# Patient Record
Sex: Female | Born: 1995 | Race: Black or African American | Hispanic: No | Marital: Single | Smoking: Never smoker
Health system: Southern US, Community
[De-identification: ages and names within clinical notes are randomized; demographics above are authoritative.]

## PROBLEM LIST (undated history)

## (undated) DIAGNOSIS — J40 Bronchitis, not specified as acute or chronic: Secondary | ICD-10-CM

---
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
## 2014-07-28 ENCOUNTER — Emergency Department (HOSPITAL_BASED_OUTPATIENT_CLINIC_OR_DEPARTMENT_OTHER)
Admission: EM | Admit: 2014-07-28 | Discharge: 2014-07-28 | Disposition: A | Discharge status: Home / Self Care | Payer: Commercial Managed Care - HMO | Attending: Emergency Medicine | Admitting: Emergency Medicine

## 2014-07-28 ENCOUNTER — Encounter (HOSPITAL_BASED_OUTPATIENT_CLINIC_OR_DEPARTMENT_OTHER): Payer: Self-pay | Admitting: Emergency Medicine

## 2014-07-28 DIAGNOSIS — N946 Dysmenorrhea, unspecified: Secondary | ICD-10-CM

## 2014-07-28 DIAGNOSIS — R109 Unspecified abdominal pain: Secondary | ICD-10-CM | POA: Diagnosis present

## 2014-07-28 DIAGNOSIS — Z8709 Personal history of other diseases of the respiratory system: Secondary | ICD-10-CM | POA: Insufficient documentation

## 2014-07-28 DIAGNOSIS — Z3202 Encounter for pregnancy test, result negative: Secondary | ICD-10-CM | POA: Diagnosis not present

## 2014-07-28 HISTORY — DX: Bronchitis, not specified as acute or chronic: J40

## 2014-07-28 LAB — PREGNANCY, URINE: PREG TEST UR: NEGATIVE

## 2014-07-28 MED ORDER — NAPROXEN 500 MG PO TABS: 500.0000 mg | ORAL_TABLET | Freq: Two times a day (BID) | ORAL | Status: DC

## 2014-07-28 MED ORDER — IBUPROFEN 800 MG PO TABS
800.0000 mg | ORAL_TABLET | Freq: Once | ORAL | Status: AC
  Administered 2014-07-28: 800 mg via ORAL
  Filled 2014-07-28: qty 1

## 2014-07-28 MED ORDER — METHOCARBAMOL 500 MG PO TABS: 500.0000 mg | ORAL_TABLET | Freq: Two times a day (BID) | ORAL | Status: DC

## 2014-07-28 MED ORDER — METHOCARBAMOL 500 MG PO TABS
ORAL_TABLET | ORAL | Status: AC
  Administered 2014-07-28: 500 mg
  Filled 2014-07-28: qty 1

## 2014-07-28 NOTE — ED Provider Notes (Signed)
CSN: [id number]     Arrival date & time 07/28/14  0531 History   First MD Initiated Contact with Patient 07/28/14 [phone]     Chief Complaint  Patient presents with  . Dysmenorrhea     (Consider location/radiation/quality/duration/timing/severity/associated sxs/prior Treatment) Patient is a 19 y.o. female presenting with cramps. The history is provided by the patient.  Abdominal Cramping This is a recurrent problem. The current episode started 3 to 5 hours ago. The problem occurs constantly. The problem has not changed since onset.Pertinent negatives include no headaches and no shortness of breath. Nothing aggravates the symptoms. Nothing relieves the symptoms. She has tried nothing for the symptoms. The treatment provided no relief.  Period started tonight and patient normally has cramps with it but these were unrelieved with one dose of otc pain reliever  Past Medical History  Diagnosis Date  . Bronchitis    History reviewed. No pertinent past surgical history. History reviewed. No pertinent family history. History  Substance Use Topics  . Smoking status: Never Smoker   . Smokeless tobacco: Never Used  . Alcohol Use: No   OB History    Gravida Para Term Preterm AB TAB SAB Ectopic Multiple Living       Review of Systems  Respiratory: Negative for shortness of breath.   Genitourinary: Positive for menstrual problem.  Neurological: Negative for headaches.  All other systems reviewed and are negative.     Allergies  Review of patient's allergies indicates no known allergies.  Home Medications   Prior to Admission medications   Not on File   BP 123/80 mmHg  Pulse 78  Temp(Src) 98.2 F (36.8 C) (Oral)  Resp 18  Ht  (1.6 m)  Wt 150 lb (68.04 kg)  BMI 26.58 kg/m2  SpO2 100%  LMP 07/28/2014 Physical Exam  Constitutional: She is oriented to person, place, and time. She appears well-developed and well-nourished. No distress.  HENT:  Head:  Normocephalic and atraumatic.  Mouth/Throat: Oropharynx is clear and moist.  Eyes: Conjunctivae and EOM are normal. Pupils are equal, round, and reactive to light.  Neck: Normal range of motion. Neck supple.  Cardiovascular: Normal rate, regular rhythm and intact distal pulses.   Pulmonary/Chest: Effort normal and breath sounds normal. She has no wheezes. She has no rales.  Abdominal: Soft. Bowel sounds are normal. There is no tenderness. There is no rebound and no guarding.  Musculoskeletal: Normal range of motion.  Neurological: She is alert and oriented to person, place, and time.  Skin: Skin is warm and dry.  Psychiatric: She has a normal mood and affect.    ED Course  Procedures (including critical care time) Labs Review Labs Reviewed  PREGNANCY, URINE    Imaging Review No results found.   EKG Interpretation None      MDM   Final diagnoses:  None   NSAIDs heat and muscle relaxant lots of liquids and follow up with GYN   Elka Satterfield, MD 07/28/14 1191

## 2014-07-28 NOTE — Discharge Instructions (Signed)
Dysmenorrhea °Dysmenorrhea is pain during a menstrual period. You will have pain in the lower belly (abdomen). The pain is caused by the tightening (contracting) of the muscles of the uterus. The pain can be minor or severe. Headache, feeling sick to your stomach (nausea), throwing up (vomiting), or low back pain may occur with this condition. °HOME CARE °· Only take medicine as told by your doctor. °· Place a heating pad or hot water bottle on your lower back or belly. Do not sleep with a heating pad. °· Exercise may help lessen the pain. °· Massage the lower back or belly. °· Stop smoking. °· Avoid alcohol and caffeine. °GET HELP IF:  °· Your pain does not get better with medicine. °· You have pain during sex. °· Your pain gets worse while taking pain medicine. °· Your period bleeding is heavier than normal. °· You keep feeling sick to your stomach or keep throwing up. °GET HELP RIGHT AWAY IF: °You pass out (faint). °Document Released: 03/22/2008 Document Revised: 12/29/2012 Document Reviewed: 06/11/2012 °[employer]® Patient Information ©2015 [employer]. This information is not intended to replace advice given to you by your health care provider. Make sure you discuss any questions you have with your health care provider. ° °

## 2014-07-28 NOTE — ED Notes (Signed)
Heat packs applied per Dr's order.

## 2014-07-28 NOTE — ED Notes (Signed)
Pt re[ports onset of severe menstraul cramping and pain at 12:30 last night pt reports as more severe than ever before

## 2014-08-05 IMAGING — CR DG CHEST 2V
2 series · 2 of 2 positions shown · non-contrast
Comparison: None available

CLINICAL DATA: Chest pain

EXAM:
CHEST - 2 VIEW

[w chest pa]
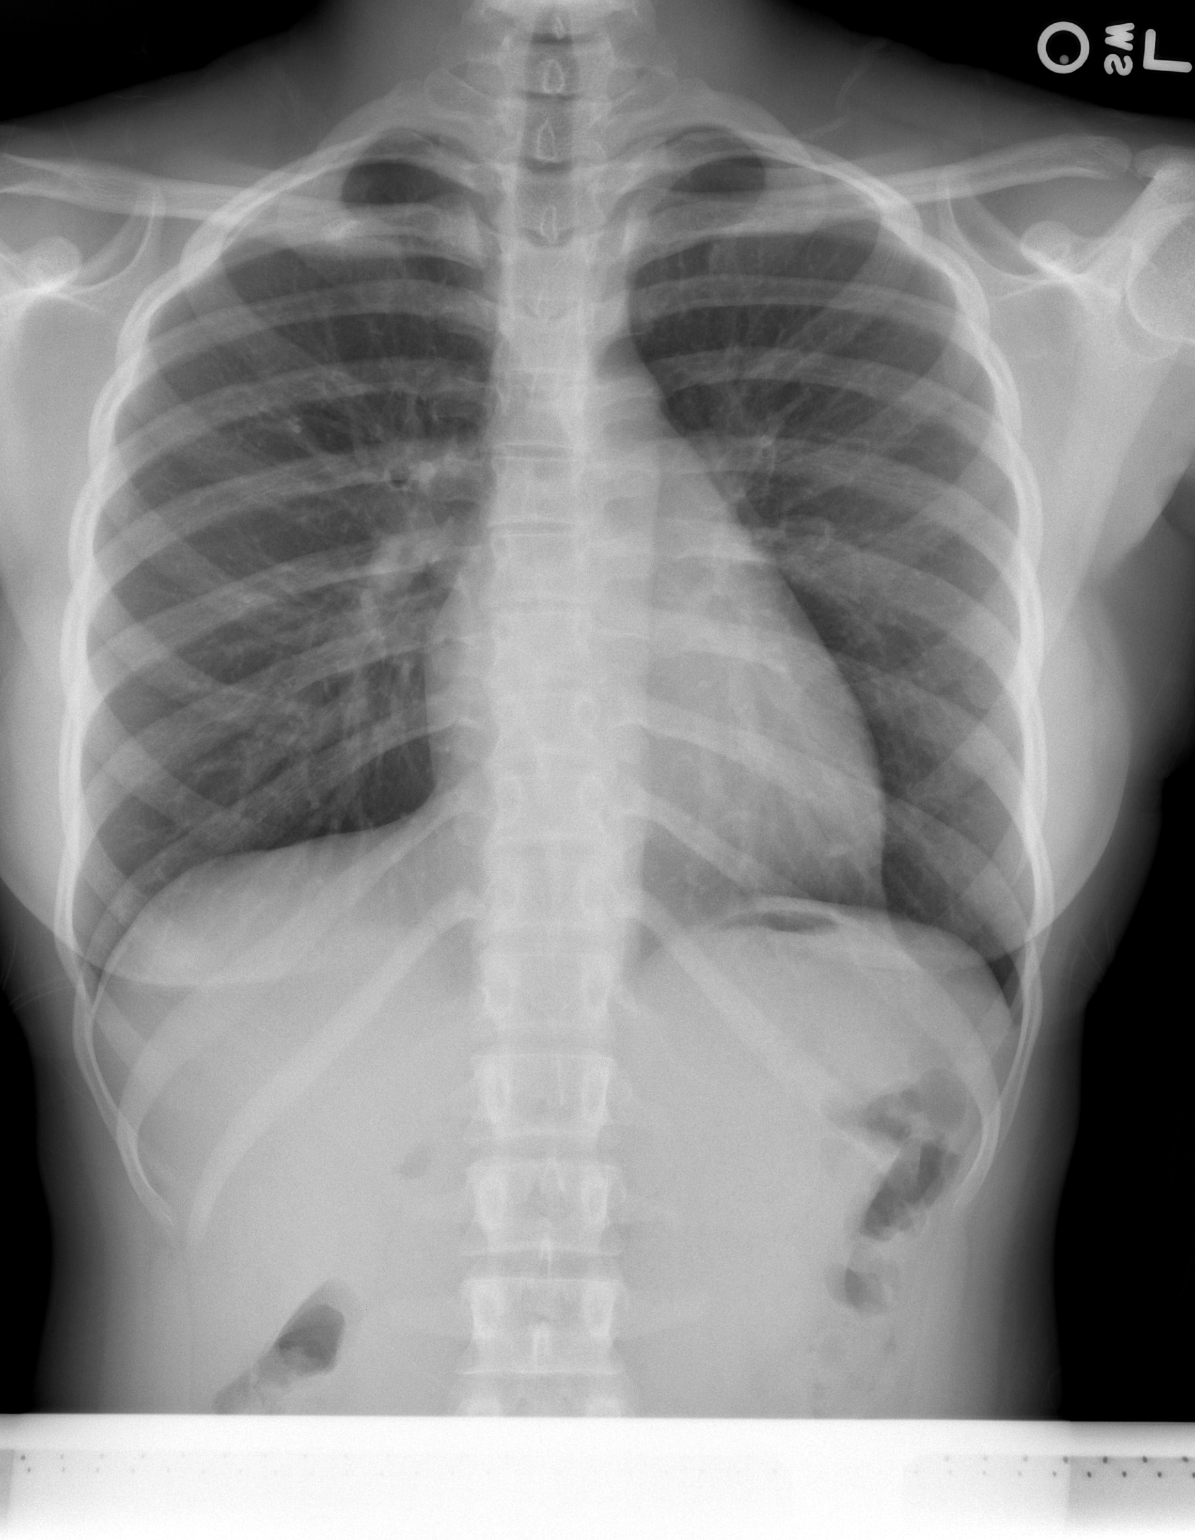

[w chest lat]
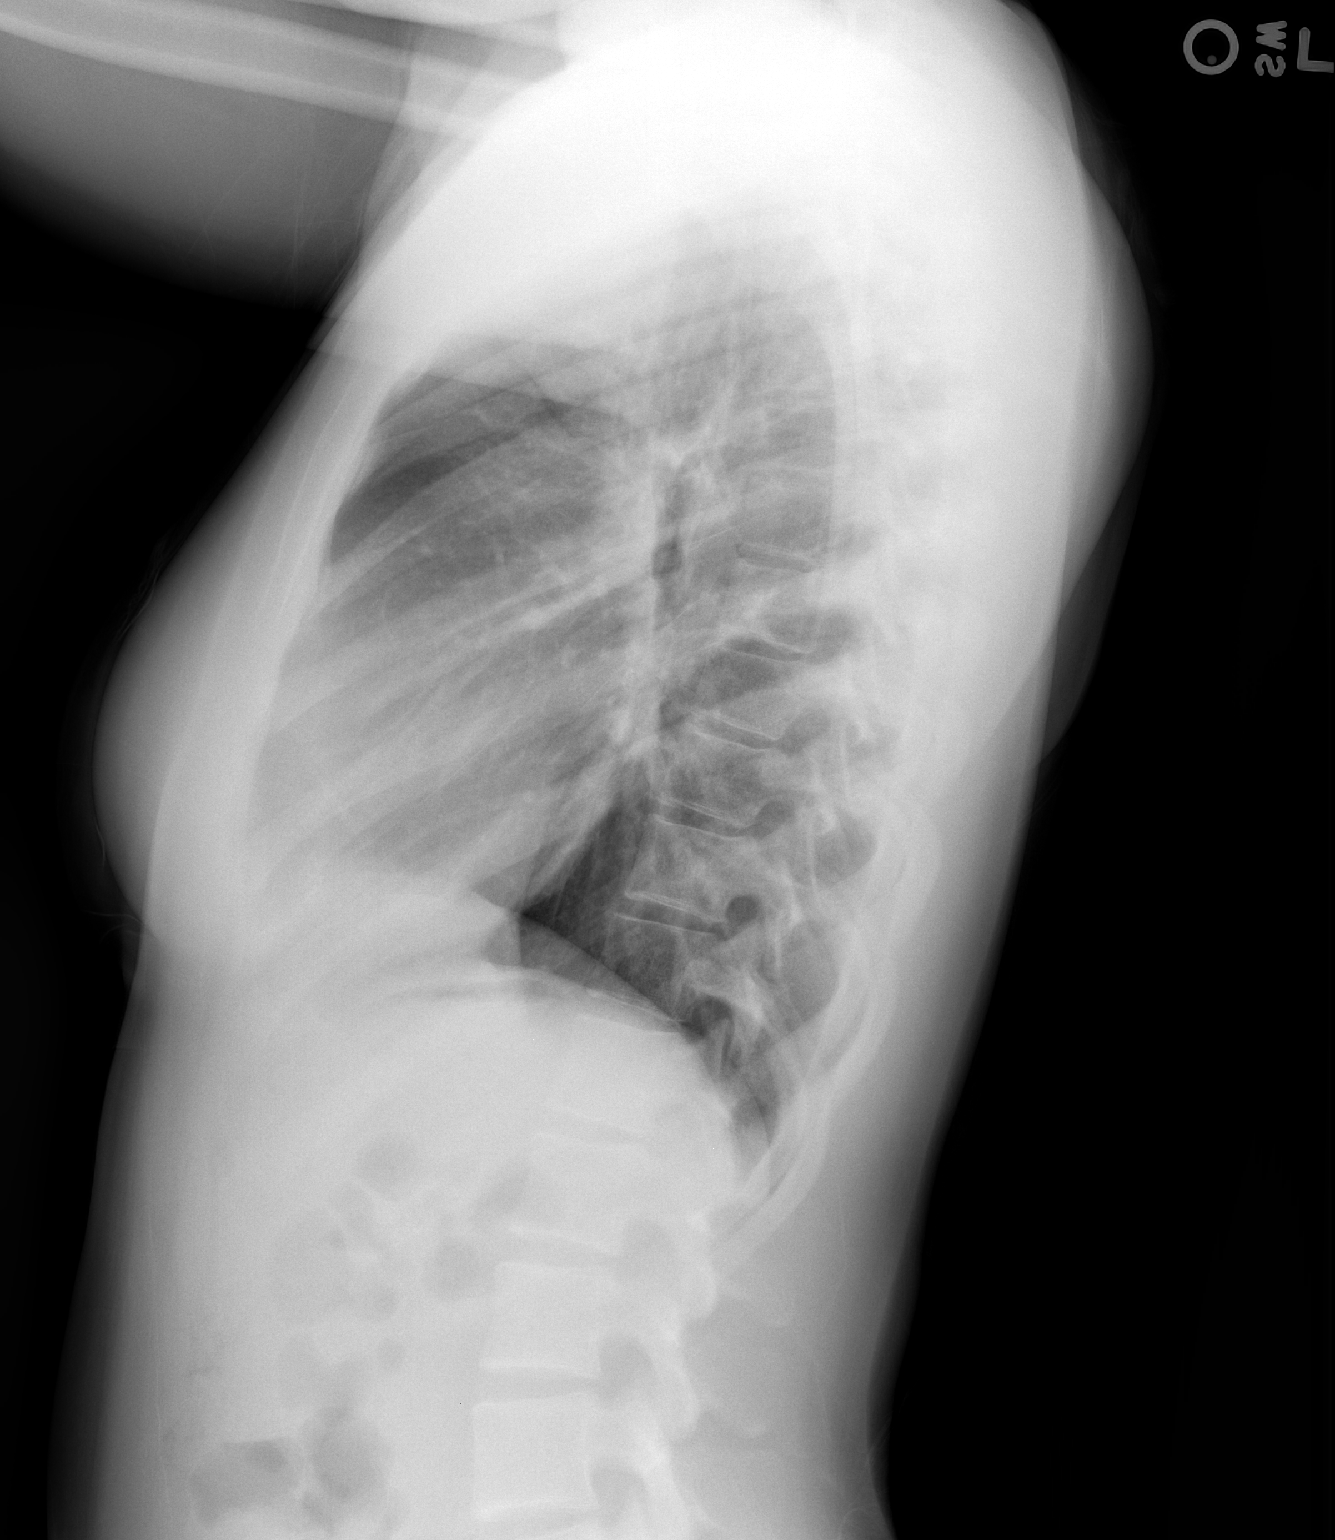

[2 of 2 positions shown; findings below may reference images not displayed]

FINDINGS: The heart size and mediastinal contours are within normal limits.
Both lungs are clear. The visualized skeletal structures are
unremarkable. No effusion.
IMPRESSION: No acute cardiopulmonary disease.

## 2016-03-18 ENCOUNTER — Ambulatory Visit (HOSPITAL_COMMUNITY)
Admission: EM | Admit: 2016-03-18 | Discharge: 2016-03-18 | Disposition: A | Discharge status: Home / Self Care | Payer: Commercial Managed Care - HMO | Attending: Emergency Medicine | Admitting: Emergency Medicine

## 2016-03-18 ENCOUNTER — Encounter (HOSPITAL_COMMUNITY): Payer: Self-pay | Admitting: Emergency Medicine

## 2016-03-18 DIAGNOSIS — L509 Urticaria, unspecified: Secondary | ICD-10-CM | POA: Diagnosis not present

## 2016-03-18 MED ORDER — PREDNISONE 10 MG (21) PO TBPK: ORAL_TABLET | ORAL | 0 refills | Status: AC

## 2016-03-18 NOTE — ED Triage Notes (Addendum)
Generalized rash from shoulders down, onset 2 days ago.  Rash itches   Patient says she has swelling in hands and knees.  No diagnoses during previous episode.

## 2016-03-18 NOTE — ED Provider Notes (Signed)
HPI  SUBJECTIVE:  Denise Hahn is a 21 y.o. female who presents with a diffuse urticarial itching   rash from her shoulders down.  She also states that she is having swelling and dull, achy pain in the joints of her fingers in her fingers and of her knees. She states that she started a new baby body wash that she does not use her face. There is no rash on her face. No lip or tongue swelling, difficulty breathing. She has tried cortisone cream and a cooling gel and an oatmeal bath with some improvement of symptoms. No aggravating factors.  States that the rash is itchy all day long. No fevers, recent viral illness, sore throat, new lotions, detergents, foods, medications. no Pets in the house. No contacts with similar rash. No antibiotics in the past month.  states that she has had similar symptoms before but no cause was found and  has  a history of seasonal allergies. past medical history negative for lupus, rheumatoid arthritis, anaphylaxis. Family history negative for rheumatoid arthritis or autoimmune disease. LMP: 2/15. Denies possibility of being pregnant. [facility]    Past Medical History:  Diagnosis Date  . Bronchitis     History reviewed. No pertinent surgical history.  No family history on file.  Social History  Substance Use Topics  . Smoking status: Never Smoker  . Smokeless tobacco: Never Used  . Alcohol use Yes    No current facility-administered medications for this encounter.   Current Outpatient Prescriptions:  .  predniSONE (STERAPRED UNI-PAK 21 TAB) 10 MG (21) TBPK tablet, Dispense one 6 day pack. Take as directed with food., Disp: 21 tablet, Rfl: 0  No Known Allergies   ROS  As noted in HPI.   Physical Exam  BP 116/74 (BP Location: Right Arm)   Pulse 76   Temp 98.6 F (37 C) (Oral)   Resp 16   LMP 02/22/2016   SpO2 100%   Constitutional: Well developed, well nourished, no acute distress Eyes:  EOMI,  conjunctiva normal bilaterally HENT: Normocephalic, atraumatic,mucus membranes moist Respiratory: Normal inspiratory effort Cardiovascular: Normal rate GI: nondistended skin: No rash, skin intact Musculoskeletal: no deformities. No joint swelling or tenderness on the hands or knees. No joint erythema, limitation of motion. Neurologic: Alert & oriented x 3, no focal neuro deficits Psychiatric: Speech and behavior appropriate   ED Course   Medications - No data to display  No orders of the defined types were placed in this encounter.   No results found for this or any previous visit (from the past 24 hour(s)). No results found.  ED Clinical Impression  Urticaria   ED Assessment/Plan  states that the rash has resolved here, however she does bring in multiple pictures showing extensive urticaria. She denies any signs of anaphylaxis. Her finger joints and knees appear normal. Plan to send home with Claritin, 25-50 mg of Benadryl at night and a 6 day prednisone taper, follow-up with primary care physician as needed, to the ER if she gets worse.  Discussed MDM, plan and followup with patient. Discussed sn/sx that should prompt return to the ED. Patient  agrees with plan.   Meds ordered this encounter  Medications  . predniSONE (STERAPRED UNI-PAK 21 TAB) 10 MG (21) TBPK tablet    Sig: Dispense one 6 day pack. Take as directed with food.    Dispense:  21 tablet    Refill:  0    *This clinic note  was created using Scientist, clinical (histocompatibility and immunogenetics). Therefore, there may be occasional mistakes despite careful proofreading.  ?   Domenick Gong, MD 03/18/16 2133

## 2016-03-18 NOTE — Discharge Instructions (Signed)
10 mg a Claritin in the morning, 25-50 mg of Benadryl at night for the next few nights. Finish the steroids. Go to the ER for the signs and symptoms we discussed.

## 2017-11-24 ENCOUNTER — Emergency Department (HOSPITAL_COMMUNITY)
Admission: EM | Admit: 2017-11-24 | Discharge: 2017-11-24 | Disposition: A | Discharge status: Home / Self Care | Payer: 59 | Attending: Emergency Medicine | Admitting: Emergency Medicine

## 2017-11-24 ENCOUNTER — Encounter (HOSPITAL_COMMUNITY): Payer: Self-pay

## 2017-11-24 DIAGNOSIS — R109 Unspecified abdominal pain: Secondary | ICD-10-CM | POA: Diagnosis not present

## 2017-11-24 DIAGNOSIS — J020 Streptococcal pharyngitis: Secondary | ICD-10-CM | POA: Insufficient documentation

## 2017-11-24 DIAGNOSIS — J029 Acute pharyngitis, unspecified: Secondary | ICD-10-CM | POA: Diagnosis present

## 2017-11-24 LAB — CBC
HCT: 41 % (ref 36.0–46.0)
Hemoglobin: 13.6 g/dL (ref 12.0–15.0)
MCH: 29.4 pg (ref 26.0–34.0)
MCHC: 33.2 g/dL (ref 30.0–36.0)
MCV: 88.7 fL (ref 80.0–100.0)
Platelets: 340 10*3/uL (ref 150–400)
RBC: 4.62 MIL/uL (ref 3.87–5.11)
RDW: 12.7 % (ref 11.5–15.5)
WBC: 10.3 10*3/uL (ref 4.0–10.5)
nRBC: 0 % (ref 0.0–0.2)

## 2017-11-24 LAB — COMPREHENSIVE METABOLIC PANEL
ALT: 19 U/L (ref 0–44)
AST: 19 U/L (ref 15–41)
Albumin: 3.7 g/dL (ref 3.5–5.0)
Alkaline Phosphatase: 57 U/L (ref 38–126)
Anion gap: 8 (ref 5–15)
BUN: 8 mg/dL (ref 6–20)
CO2: 23 mmol/L (ref 22–32)
Calcium: 9.1 mg/dL (ref 8.9–10.3)
Chloride: 106 mmol/L (ref 98–111)
Creatinine, Ser: 0.9 mg/dL (ref 0.44–1.00)
GFR calc Af Amer: >60 mL/min (ref >60)
GFR calc non Af Amer: >60 mL/min (ref >60)
Glucose, Bld: 90 mg/dL (ref 70–99)
Potassium: 3.5 mmol/L (ref 3.5–5.1)
Sodium: 137 mmol/L (ref 135–145)
Total Bilirubin: 0.7 mg/dL (ref 0.3–1.2)
Total Protein: 8 g/dL (ref 6.5–8.1)

## 2017-11-24 LAB — LIPASE, BLOOD: Lipase: 26 U/L (ref 11–51)

## 2017-11-24 LAB — URINALYSIS, ROUTINE W REFLEX MICROSCOPIC
Bilirubin Urine: NEGATIVE
Glucose, UA: NEGATIVE mg/dL
Hgb urine dipstick: NEGATIVE
Ketones, ur: 80 mg/dL — AB
Leukocytes, UA: NEGATIVE
Nitrite: NEGATIVE
Protein, ur: NEGATIVE mg/dL
Specific Gravity, Urine: 1.03 (ref 1.005–1.030)
pH: 5 (ref 5.0–8.0)

## 2017-11-24 LAB — I-STAT BETA HCG BLOOD, ED (MC, WL, AP ONLY): I-stat hCG, quantitative: <5 m[IU]/mL (ref <5)

## 2017-11-24 LAB — GROUP A STREP BY PCR: Group A Strep by PCR: DETECTED — AB

## 2017-11-24 MED ORDER — IBUPROFEN 200 MG PO TABS
600.0000 mg | ORAL_TABLET | Freq: Once | ORAL | Status: AC
Start: 2017-11-24 — End: 2017-11-24
  Administered 2017-11-24: 600 mg via ORAL
  Filled 2017-11-24: qty 1

## 2017-11-24 MED ORDER — OXYCODONE-ACETAMINOPHEN 5-325 MG PO TABS
1.0000 | ORAL_TABLET | Freq: Once | ORAL | Status: AC
  Administered 2017-11-24: 1 via ORAL
  Filled 2017-11-24: qty 1

## 2017-11-24 MED ORDER — PENICILLIN G BENZATHINE & PROC 1200000 UNIT/2ML IM SUSP
1.2000 10*6.[IU] | Freq: Once | INTRAMUSCULAR | Status: AC
  Administered 2017-11-24: 1.2 10*6.[IU] via INTRAMUSCULAR
  Filled 2017-11-24: qty 2

## 2017-11-24 MED ORDER — HYDROCODONE-ACETAMINOPHEN 5-325 MG PO TABS: 2.0000 | ORAL_TABLET | Freq: Four times a day (QID) | ORAL | 0 refills | Status: AC | PRN

## 2017-11-24 NOTE — ED Triage Notes (Signed)
Pt states that since yesterday she has been having a sore throat having nausea with upper abd pain. Reports fever since yesterday.

## 2017-11-30 NOTE — ED Provider Notes (Signed)
[facility] EMERGENCY DEPARTMENT Provider Note   CSN: [id number] Arrival date & time: 11/24/17  2004     History   Chief Complaint Chief Complaint  Patient presents with  . Abdominal Pain  . Sore Throat    HPI Denise Hahn is a 22 y.o. female.  HPI   22yF with sore throat and fever. Onset yesterday. Persistent since. No cough. Nausea. Some vague upper abdominal pain. No vomiting. No diarrhea. No urinary complaints. No rash. Hasn't tried taking anything for symptoms.   Past Medical History:  Diagnosis Date  . Bronchitis     There are no active problems to display for this patient.   History reviewed. No pertinent surgical history.   OB History    Gravida  0   Para  0   Term  0   Preterm  0   AB  0   Living  0     SAB  0   TAB  0   Ectopic  0   Multiple  0   Live Births               Home Medications    Prior to Admission medications   Medication Sig Start Date End Date Taking? Authorizing Provider  HYDROcodone-acetaminophen (NORCO/VICODIN) 5-325 MG tablet Take 2 tablets by mouth every 6 (six) hours as needed. 11/24/17   Raeford Razor, MD  predniSONE (STERAPRED UNI-PAK 21 TAB) 10 MG (21) TBPK tablet Dispense one 6 day pack. Take as directed with food. 03/18/16   Domenick Gong, MD    Family History No family history on file.  Social History Social History   Tobacco Use  . Smoking status: Never Smoker  . Smokeless tobacco: Never Used  Substance Use Topics  . Alcohol use: Yes  . Drug use: No     Allergies   Patient has no known allergies.   Review of Systems Review of Systems  All systems reviewed and negative, other than as noted in HPI.  Physical Exam Updated Vital Signs BP 117/72 (BP Location: Right Arm)   Pulse 99   Temp (!) 101.5 F (38.6 C) (Oral)   Resp 16   LMP 11/17/2017   SpO2 100%   Physical Exam  Constitutional: She appears well-developed and well-nourished. No distress.    HENT:  Head: Normocephalic and atraumatic.  Mouth/Throat: Oropharyngeal exudate present.  pharyngitis with exudate. uvula midline. Handling secretions. Normal sounding voice. Neck supple.   Eyes: Conjunctivae are normal. Right eye exhibits no discharge. Left eye exhibits no discharge.  Neck: Neck supple.  Cardiovascular: Regular rhythm and normal heart sounds. Exam reveals no gallop and no friction rub.  No murmur heard. Mild tachcyardia  Pulmonary/Chest: Effort normal and breath sounds normal. No respiratory distress.  Abdominal: Soft. She exhibits no distension. There is no tenderness.  Musculoskeletal: She exhibits no edema or tenderness.  Neurological: She is alert.  Skin: Skin is warm and dry.  Psychiatric: She has a normal mood and affect. Her behavior is normal. Thought content normal.  Nursing note and vitals reviewed.    ED Treatments / Results  Labs (all labs ordered are listed, but only abnormal results are displayed) Labs Reviewed  GROUP A STREP BY PCR - Abnormal; Notable for the following components:      Result Value   Group A Strep by PCR DETECTED (*)    All other components within normal limits  URINALYSIS, ROUTINE W REFLEX MICROSCOPIC - Abnormal; Notable for the  following components:   Ketones, ur 80 (*)    All other components within normal limits  LIPASE, BLOOD  COMPREHENSIVE METABOLIC PANEL  CBC  I-STAT BETA HCG BLOOD, ED ([facility], [facility], [facility] ONLY)    EKG None  Radiology No results found.  Procedures Procedures (including critical care time)  Medications Ordered in ED Medications  ibuprofen (ADVIL,MOTRIN) tablet 600 mg (600 mg Oral Given 11/24/17 2239)  oxyCODONE-acetaminophen (PERCOCET/ROXICET) 5-325 MG per tablet 1 tablet (1 tablet Oral Given 11/24/17 2240)  penicillin g procaine-penicillin g benzathine (BICILLIN-CR) injection 600000-600000 units (1.2 Million Units Intramuscular Given 11/24/17 2331)     Initial Impression / Assessment and Plan / ED  Course  I have reviewed the triage vital signs and the nursing notes.  Pertinent labs & imaging results that were available during my care of the patient were reviewed by me and considered in my medical decision making (see chart for details).     22yF with fever and sore throat. Strep pos. Airway fine. IM bicillin. Some abdominal pain with a benign exam. Non-toxic on exam. Tolerating PO. Advised to try and push fluids. Prescribed a few norco PRN and also told to take ibuprofen.    Final Clinical Impressions(s) / ED Diagnoses   Final diagnoses:  Strep pharyngitis    ED Discharge Orders         Ordered    HYDROcodone-acetaminophen (NORCO/VICODIN) 5-325 MG tablet  Every 6 hours PRN     11/24/17 2327           Raeford RazorKohut, Maclane Holloran, MD 11/30/17 1130

## 2018-08-15 ENCOUNTER — Other Ambulatory Visit: Payer: Self-pay

## 2018-08-15 DIAGNOSIS — Z20822 Contact with and (suspected) exposure to covid-19: Secondary | ICD-10-CM

## 2018-08-16 LAB — NOVEL CORONAVIRUS, NAA: SARS-CoV-2, NAA: NOT DETECTED

## 2018-12-08 ENCOUNTER — Other Ambulatory Visit: Payer: Self-pay

## 2018-12-08 DIAGNOSIS — Z20822 Contact with and (suspected) exposure to covid-19: Secondary | ICD-10-CM

## 2018-12-10 LAB — NOVEL CORONAVIRUS, NAA: SARS-CoV-2, NAA: NOT DETECTED
